# Patient Record
Sex: Male | Born: 1975 | Smoking: Never smoker
Health system: Southern US, Community
[De-identification: ages and names within clinical notes are randomized; demographics above are authoritative.]

## PROBLEM LIST (undated history)

## (undated) DIAGNOSIS — E119 Type 2 diabetes mellitus without complications: Secondary | ICD-10-CM

## (undated) DIAGNOSIS — E079 Disorder of thyroid, unspecified: Secondary | ICD-10-CM

## (undated) DIAGNOSIS — K589 Irritable bowel syndrome without diarrhea: Secondary | ICD-10-CM

## (undated) DIAGNOSIS — F319 Bipolar disorder, unspecified: Secondary | ICD-10-CM

---

## 2017-11-15 ENCOUNTER — Encounter (HOSPITAL_COMMUNITY): Payer: Self-pay | Admitting: Emergency Medicine

## 2017-11-15 ENCOUNTER — Other Ambulatory Visit: Payer: Self-pay

## 2017-11-15 ENCOUNTER — Emergency Department (HOSPITAL_COMMUNITY)
Admission: EM | Admit: 2017-11-15 | Discharge: 2017-11-15 | Disposition: A | Discharge status: Home / Self Care | Payer: Self-pay | Attending: Emergency Medicine | Admitting: Emergency Medicine

## 2017-11-15 ENCOUNTER — Emergency Department (HOSPITAL_COMMUNITY): Payer: Self-pay

## 2017-11-15 DIAGNOSIS — Y929 Unspecified place or not applicable: Secondary | ICD-10-CM | POA: Insufficient documentation

## 2017-11-15 DIAGNOSIS — S40022A Contusion of left upper arm, initial encounter: Secondary | ICD-10-CM | POA: Insufficient documentation

## 2017-11-15 DIAGNOSIS — E119 Type 2 diabetes mellitus without complications: Secondary | ICD-10-CM | POA: Insufficient documentation

## 2017-11-15 DIAGNOSIS — Y999 Unspecified external cause status: Secondary | ICD-10-CM | POA: Insufficient documentation

## 2017-11-15 DIAGNOSIS — Z79899 Other long term (current) drug therapy: Secondary | ICD-10-CM | POA: Insufficient documentation

## 2017-11-15 DIAGNOSIS — S7012XA Contusion of left thigh, initial encounter: Secondary | ICD-10-CM | POA: Insufficient documentation

## 2017-11-15 DIAGNOSIS — Y939 Activity, unspecified: Secondary | ICD-10-CM | POA: Insufficient documentation

## 2017-11-15 HISTORY — DX: Bipolar disorder, unspecified: F31.9

## 2017-11-15 HISTORY — DX: Irritable bowel syndrome without diarrhea: K58.9

## 2017-11-15 HISTORY — DX: Disorder of thyroid, unspecified: E07.9

## 2017-11-15 HISTORY — DX: Type 2 diabetes mellitus without complications: E11.9

## 2017-11-15 MED ORDER — IBUPROFEN 800 MG PO TABS: 800.0000 mg | ORAL_TABLET | Freq: Three times a day (TID) | ORAL | 0 refills | Status: AC
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
## 2017-11-15 NOTE — Discharge Instructions (Signed)
Xrays negative Ibuprofen for pain Ice and rest for 48 hours
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
